# Patient Record
Sex: Female | Born: 2005 | Race: Black or African American | Hispanic: No
Health system: Southern US, Community
[De-identification: ages and names within clinical notes are randomized; demographics above are authoritative.]

## PROBLEM LIST (undated history)

## (undated) DIAGNOSIS — J302 Other seasonal allergic rhinitis: Secondary | ICD-10-CM

---
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
## 2024-03-04 ENCOUNTER — Ambulatory Visit
Admission: RE | Admit: 2024-03-04 | Discharge: 2024-03-04 | Disposition: A | Discharge status: Home / Self Care | Source: Ambulatory Visit | Attending: Family Medicine | Admitting: Family Medicine

## 2024-03-04 VITALS — BP 139/80 | HR 90 | Temp 98.5°F | Resp 17

## 2024-03-04 DIAGNOSIS — U071 COVID-19: Secondary | ICD-10-CM | POA: Diagnosis not present

## 2024-03-04 DIAGNOSIS — R051 Acute cough: Secondary | ICD-10-CM

## 2024-03-04 LAB — POC SOFIA SARS ANTIGEN FIA: SARS Coronavirus 2 Ag: POSITIVE — AB

## 2024-03-04 MED ORDER — ONDANSETRON HCL 4 MG PO TABS: 4.0000 mg | ORAL_TABLET | Freq: Three times a day (TID) | ORAL | Status: DC | PRN

## 2024-03-04 NOTE — ED Provider Notes (Signed)
 [facility]    CSN: [id number] Arrival date & time: 03/04/24  1446      History   Chief Complaint Chief Complaint  Patient presents with   Nausea   Chills    HPI Susan Gallagher is a 18 y.o. female  presents for evaluation of URI symptoms for 3 days. Patient reports associated symptoms of cough, congestion, nausea, chills, sore throat. Denies vomiting, diarrhea, fevers, ear pain, body aches, shortness of breath. Patient does not have a hx of asthma. Patient is not an active smoker.   Reports  sick contacts via boyfriend.  Pt has taken TheraFlu OTC for symptoms. Pt has no other concerns at this time.   HPI  Past Medical History:  Diagnosis Date   Seasonal allergies     Patient Active Problem List   Diagnosis Date Noted   Obesity due to excess calories without serious comorbidity with body mass index (BMI) in 95th to 98th percentile for age in pediatric patient 06/05/2017   Pre-diabetes 08/06/2016   Acanthosis 12/03/2015   Elevated hemoglobin A1c 12/03/2015   Abnormal thyroid  function test 12/03/2015    History reviewed. No pertinent surgical history.  OB History   No obstetric history on file.      Home Medications    Prior to Admission medications   Medication Sig Start Date End Date Taking? Authorizing Provider  ondansetron  (ZOFRAN ) 4 MG tablet Take 1 tablet (4 mg total) by mouth every 8 (eight) hours as needed for nausea or vomiting. 03/04/24  Yes Pinchas Reither, Jodi R, NP  cetirizine  (ZYRTEC  ALLERGY) 10 MG tablet Take 1 tablet (10 mg total) by mouth daily. 01/30/22   Stuart Vernell Norris, PA-C  cetirizine  (ZYRTEC ) 1 MG/ML syrup Take 5 mg by mouth at bedtime. Reported on 01/14/2016    [provider]  fluticasone  (FLONASE ) 50 MCG/ACT nasal spray Place 1 spray into both nostrils 2 (two) times daily. 01/30/22   Stuart Vernell Norris, PA-C  levothyroxine  (SYNTHROID , LEVOTHROID) 25 MCG tablet GIVE [place] 1 TABLET(25 MCG) BY MOUTH DAILY BEFORE BREAKFAST 12/21/17    Verdon Darnel, NP  pyrethrins-piperonyl butoxide (RID) 0.3-3 % SHAM Apply 1 application topically once. Reported on 01/14/2016    [provider]    Family History History reviewed. No pertinent family history.  Social History Social History   Tobacco Use   Smoking status: Passive Smoke Exposure - Never Smoker   Smokeless tobacco: Never  Substance Use Topics   Alcohol use: No   Drug use: No     Allergies   Patient has no known allergies.   Review of Systems Review of Systems  Constitutional:  Positive for chills.  HENT:  Positive for congestion and sore throat.   Respiratory:  Positive for cough.   Gastrointestinal:  Positive for nausea.     Physical Exam Triage Vital Signs ED Triage Vitals  Encounter Vitals Group     BP 03/04/24 1517 139/80     Girls Systolic BP Percentile --      Girls Diastolic BP Percentile --      Boys Systolic BP Percentile --      Boys Diastolic BP Percentile --      Pulse Rate 03/04/24 1517 90     Resp 03/04/24 1517 17     Temp 03/04/24 1517 98.5 F (36.9 C)     Temp Source 03/04/24 1517 Oral     SpO2 03/04/24 1517 98 %     Weight --  Height --      Head Circumference --      Peak Flow --      Pain Score 03/04/24 1516 0     Pain Loc --      Pain Education --      Exclude from Growth Chart --    No data found.  Updated Vital Signs BP 139/80 (BP Location: Right Arm)   Pulse 90   Temp 98.5 F (36.9 C) (Oral)   Resp 17   LMP 03/04/2024 (Exact Date)   SpO2 98%   Visual Acuity Right Eye Distance:   Left Eye Distance:   Bilateral Distance:    Right Eye Near:   Left Eye Near:    Bilateral Near:     Physical Exam Vitals and nursing note reviewed.  Constitutional:      General: She is not in acute distress.    Appearance: She is well-developed. She is not ill-appearing.  HENT:     Head: Normocephalic and atraumatic.     Right Ear: Tympanic membrane and ear canal normal.     Left Ear: Tympanic membrane  and ear canal normal.     Nose: Congestion present.     Mouth/Throat:     Mouth: Mucous membranes are moist.     Pharynx: Oropharynx is clear. Uvula midline. No oropharyngeal exudate or posterior oropharyngeal erythema.     Tonsils: No tonsillar exudate or tonsillar abscesses.  Eyes:     Conjunctiva/sclera: Conjunctivae normal.     Pupils: Pupils are equal, round, and reactive to light.  Cardiovascular:     Rate and Rhythm: Normal rate and regular rhythm.     Heart sounds: Normal heart sounds.  Pulmonary:     Effort: Pulmonary effort is normal.     Breath sounds: Normal breath sounds. No wheezing or rhonchi.  Musculoskeletal:     Cervical back: Normal range of motion and neck supple.  Lymphadenopathy:     Cervical: No cervical adenopathy.  Skin:    General: Skin is warm and dry.  Neurological:     General: No focal deficit present.     Mental Status: She is alert and oriented to person, place, and time.  Psychiatric:        Mood and Affect: Mood normal.        Behavior: Behavior normal.      UC Treatments / Results  Labs (all labs ordered are listed, but only abnormal results are displayed) Labs Reviewed  POC SOFIA SARS ANTIGEN FIA - Abnormal; Notable for the following components:      Result Value   SARS Coronavirus 2 Ag Positive (*)    All other components within normal limits    EKG   Radiology No results found.  Procedures Procedures (including critical care time)  Medications Ordered in UC Medications - No data to display  Initial Impression / Assessment and Plan / UC Course  I have reviewed the triage vital signs and the nursing notes.  Pertinent labs & imaging results that were available during my care of the patient were reviewed by me and considered in my medical decision making (see chart for details).     Reviewed exam and symptoms with patient.  No red flags.  Positive COVID-19.  States she has had in past without complication or  hospitalization.  Will do Zofran  as needed for nausea.  Declines antiviral therapy.  Discussed viral illness and symptomatic treatment.  Advised PCP follow-up if symptoms do not  improve.  ER precautions reviewed. Final Clinical Impressions(s) / UC Diagnoses   Final diagnoses:  Acute cough  COVID-19     Discharge Instructions      You have tested positive for COVID.  You may use Zofran  every 8 hours as needed for nausea.  Please treat your symptoms with over the counter cough medication, tylenol  or ibuprofen , humidifier, and rest. Viral illnesses can last 7-14 days. Please follow up with your PCP if your symptoms are not improving. Please go to the ER for any worsening symptoms. This includes but is not limited to fever you can not control with tylenol  or ibuprofen , you are not able to stay hydrated, you have shortness of breath or chest pain.  Thank you for choosing [facility] for your healthcare needs. I hope you feel better soon!      ED Prescriptions     Medication Sig Dispense Auth. Provider   ondansetron  (ZOFRAN ) 4 MG tablet Take 1 tablet (4 mg total) by mouth every 8 (eight) hours as needed for nausea or vomiting. 8 tablet Tayllor Breitenstein, Jodi R, NP      PDMP not reviewed this encounter.   Loreda Myla SAUNDERS, NP 03/04/24 [phone]

## 2024-03-04 NOTE — ED Triage Notes (Signed)
 Pt present with nausea, dizziness and chills x three days.  Home interventions: therafly States she needs a note for work.

## 2024-03-04 NOTE — Discharge Instructions (Signed)
 You have tested positive for COVID.  You may use Zofran  every 8 hours as needed for nausea.  Please treat your symptoms with over the counter cough medication, tylenol  or ibuprofen , humidifier, and rest. Viral illnesses can last 7-14 days. Please follow up with your PCP if your symptoms are not improving. Please go to the ER for any worsening symptoms. This includes but is not limited to fever you can not control with tylenol  or ibuprofen , you are not able to stay hydrated, you have shortness of breath or chest pain.  Thank you for choosing [facility] for your healthcare needs. I hope you feel better soon!

## 2024-05-04 ENCOUNTER — Ambulatory Visit
Admission: RE | Admit: 2024-05-04 | Discharge: 2024-05-04 | Disposition: A | Discharge status: Home / Self Care | Source: Ambulatory Visit | Attending: Occupational Medicine | Admitting: Occupational Medicine

## 2024-05-04 ENCOUNTER — Other Ambulatory Visit: Payer: Self-pay | Admitting: Occupational Medicine

## 2024-05-04 ENCOUNTER — Ambulatory Visit: Payer: Self-pay | Admitting: General Practice

## 2024-05-04 VITALS — BP 112/60 | HR 68 | Temp 97.7°F | Resp 18

## 2024-05-04 DIAGNOSIS — M79641 Pain in right hand: Secondary | ICD-10-CM | POA: Diagnosis present

## 2024-05-04 DIAGNOSIS — Y990 Civilian activity done for income or pay: Secondary | ICD-10-CM | POA: Insufficient documentation

## 2024-05-04 DIAGNOSIS — R52 Pain, unspecified: Secondary | ICD-10-CM | POA: Insufficient documentation

## 2024-05-04 DIAGNOSIS — X58XXXA Exposure to other specified factors, initial encounter: Secondary | ICD-10-CM | POA: Insufficient documentation

## 2024-05-04 NOTE — Progress Notes (Addendum)
 Employee presents to the clinic this am with a Rt hand injury, 4th & 5th digits with more pain than others. Reports Rt hand was smashed between a ladder and shelving at 0730 today. States having constant shooting pain from last 2 digits up the Rt forearm. No other complaints.  VSS; RN assessed Rt hand, no redness, decrease in color, no cold temperature and no bruising noted. Positive radial pulse felt, slight swelling to the hand noted. Employee is able to make a Rt hand partial fist, wiggle fingers and spread fingers apart slowly. Ice pack applied to the Rt hand, secured with medical tape. Employee states ice pack was helping the pain slightly subside at the end of the appointment.  Appointment secured at the [facility] site clinic. Appropriate paperwork and contact information to the clinic provided. RN will follow up later today to further assess needs.   Addendum to earlier note: RN placed a call to follow up with employee this afternoon. Per employee, she was seen by the PA and an xray was done. Requested d/c orders to be emailed to the clinic and email was received. Advised to take Tylenol  per d/c papers or Ibuprofen  after having something to eat. Will follow up tomorrow.

## 2024-06-16 ENCOUNTER — Ambulatory Visit: Admitting: Registered Nurse

## 2024-06-16 ENCOUNTER — Encounter: Payer: Self-pay | Admitting: Registered Nurse

## 2024-06-16 VITALS — BP 130/74 | HR 90 | Temp 99.9°F | Resp 16

## 2024-06-16 DIAGNOSIS — J039 Acute tonsillitis, unspecified: Secondary | ICD-10-CM

## 2024-06-16 MED ORDER — ACETAMINOPHEN 500 MG PO TABS: 1000.0000 mg | ORAL_TABLET | Freq: Four times a day (QID) | ORAL | Status: AC | PRN

## 2024-06-16 MED ORDER — IBUPROFEN 800 MG PO TABS: 800.0000 mg | ORAL_TABLET | Freq: Three times a day (TID) | ORAL | Status: AC

## 2024-06-16 MED ORDER — PREDNISONE 10 MG PO TABS: ORAL_TABLET | ORAL | Status: AC

## 2024-06-16 MED ORDER — AMOXICILLIN 875 MG PO TABS: 875.0000 mg | ORAL_TABLET | Freq: Two times a day (BID) | ORAL | 0 refills | Status: AC

## 2024-06-16 NOTE — Patient Instructions (Signed)
 Tonsillitis  Tonsillitis is an infection of the throat that causes the tonsils to become red, tender, and swollen. Tonsils are tissues in the back of your throat. Each tonsil has crevices (crypts). Tonsils normally work to protect the body from infection. What are the causes? Sudden (acute) tonsillitis may be caused by a virus or bacteria, including streptococcal bacteria. Long-lasting (chronic) tonsillitis occurs when the crypts of the tonsils become filled with pieces of food and bacteria, which makes it easy for the tonsils to become repeatedly infected. Tonsillitis can be spread from person to person when it is caused by a virus or bacteria. It may be spread by inhaling droplets that are released with coughing or sneezing. You may also come into contact with viruses or bacteria on surfaces, such as cups or utensils. What are the signs or symptoms? Symptoms of this condition include: A sore throat. This may include trouble swallowing. White patches on the tonsils. Swollen tonsils. Fever. Headache. Tiredness. Loss of appetite. Snoring during sleep when you did not snore before. Small, foul-smelling, yellowish-white pieces of material (tonsilloliths) that you occasionally cough up or spit out. These can cause you to have bad breath. How is this diagnosed? This condition is diagnosed with a physical exam. Diagnosis can be confirmed with the results of lab tests, including a throat culture. How is this treated? Treatment for this condition depends on the cause, but usually focuses on treating the symptoms associated with it. Treatment may include: Medicines to relieve pain and manage fever. Steroid medicines to reduce swelling. Antibiotic medicines if the condition is caused by bacteria. If episodes of tonsillitis are severe and frequent, your health care provider may recommend surgery to remove the tonsils (tonsillectomy). Follow these instructions at home: Medicines Take over-the-counter  and prescription medicines only as told by your health care provider. If you were prescribed an antibiotic medicine, take it as told by your health care provider. Do not stop taking the antibiotic even if you start to feel better. Eating and drinking Drink enough fluid to keep your urine pale yellow. While your throat is sore, eat soft or liquid foods, such as sherbet, soups, or soft, warm cereals, such as oatmeal or hot wheat cereal. Drink warm liquids. Eat frozen ice pops. General instructions Rest as much as possible and get plenty of sleep. Gargle with a mixture of salt and water 3-4 times a day or as needed. To make salt water, completely dissolve -1 tsp (3-6 g) of salt in 1 cup (237 mL) of warm water. Do not swallow the mixture of salt and water. Wash your hands regularly with soap and water for at least 20 seconds. If soap and water are not available, use hand sanitizer. Do not share cups, bottles, or other utensils until your symptoms have gone away. Do not use any products that contain nicotine or tobacco. These products include cigarettes, chewing tobacco, and vaping devices, such as e-cigarettes. If you need help quitting, ask your health care provider. Keep all follow-up visits. This is important. Contact a health care provider if: You notice large, tender lumps in your neck that were not there before. You have a fever that does not go away after 2-3 days. You develop a rash. You cough up a green, yellow-brown, or bloody substance. You cannot swallow liquids or food for 24 hours. Only one of your tonsils is swollen. Get help right away if: You develop any new symptoms, such as vomiting, severe headache, stiff neck, chest pain, trouble breathing, or  trouble swallowing. You have severe throat pain along with drooling or voice changes. You have severe pain that is not controlled with medicines. You cannot fully open your mouth. You develop redness, swelling, or severe pain  anywhere in your neck. Summary Tonsillitis is an infection of the throat that causes the tonsils to become red, tender, and swollen. The most common symptom is pain in the throat. Tonsillitis is most often caused by a virus or bacteria. Get help right away if you develop any new symptoms, such as vomiting, severe headache, stiff neck, chest pain, or trouble breathing. This information is not intended to replace advice given to you by your health care provider. Make sure you discuss any questions you have with your health care provider. Document Revised: 11/07/2020 Document Reviewed: 11/08/2020 Elsevier Patient Education  2024 [employer].

## 2024-06-16 NOTE — Progress Notes (Signed)
 Established Patient Office Visit  Subjective   Patient ID: Susan Gallagher, female    DOB: 11/01/2005  Age: 18 y.o. MRN: [id number]  Chief Complaint  Patient presents with   Sore Throat    Headache tonsils swollen    18y/o new patient here for evaluation throat pain and headache.  Has tried tylenol  at home.  Some pain/difficulty with swallowing.  Denied shortness of breath, drooling, wheezing, chills.  Has required steroid pills in the past when tonsils large.      Review of Systems  Constitutional:  Positive for malaise/fatigue. Negative for chills.  HENT:  Positive for congestion and sore throat. Negative for ear discharge, ear pain, hearing loss, nosebleeds, sinus pain and tinnitus.   Eyes:  Negative for pain, discharge and redness.  Respiratory:  Negative for cough, hemoptysis, sputum production, shortness of breath, wheezing and stridor.   Cardiovascular:  Negative for chest pain.  Gastrointestinal:  Negative for diarrhea and vomiting.  Genitourinary:  Negative for dysuria.  Musculoskeletal:  Negative for myalgias.  Skin:  Negative for rash.  Neurological:  Positive for headaches. Negative for dizziness, tingling, tremors, speech change, seizures and weakness.  Endo/Heme/Allergies:  Positive for environmental allergies.      Objective:     BP 130/74 (Patient Position: Sitting)   Pulse 90   Temp 99.9 F (37.7 C) (Tympanic)   Resp 16   LMP 05/22/2024 (Exact Date)   SpO2 98%    Physical Exam Vitals and nursing note reviewed.  Constitutional:      General: She is awake. She is not in acute distress.    Appearance: Normal appearance. She is well-developed and well-groomed. She is obese. She is not ill-appearing, toxic-appearing or diaphoretic.  HENT:     Head: Normocephalic and atraumatic. No raccoon eyes, Battle's sign, right periorbital erythema or left periorbital erythema.     Jaw: There is normal jaw occlusion.     Salivary Glands: Right salivary gland is not  diffusely enlarged or tender. Left salivary gland is not diffusely enlarged or tender.     Right Ear: Hearing and external ear normal. No decreased hearing noted. No laceration, drainage, swelling or tenderness. A middle ear effusion is present. There is no impacted cerumen. No foreign body. No mastoid tenderness. No PE tube. No hemotympanum. Tympanic membrane is not injected, scarred, perforated or erythematous.     Left Ear: Hearing and external ear normal. No decreased hearing noted. No laceration, drainage, swelling or tenderness. A middle ear effusion is present. There is no impacted cerumen. No foreign body. No mastoid tenderness. No PE tube. No hemotympanum. Tympanic membrane is erythematous. Tympanic membrane is not injected, scarred or perforated.     Ears:     Comments: Bilateral TMs intact air fluid level clear; left TM with mild erythema along with proximal canal; no debris bilateral auditory canals    Nose: Nose normal. No mucosal edema, congestion or rhinorrhea.     Right Nostril: No epistaxis.     Left Nostril: No epistaxis.     Right Turbinates: Not enlarged, swollen or pale.     Left Turbinates: Not enlarged, swollen or pale.     Right Sinus: No maxillary sinus tenderness or frontal sinus tenderness.     Left Sinus: No maxillary sinus tenderness or frontal sinus tenderness.     Mouth/Throat:     Lips: Pink. No lesions.     Mouth: Mucous membranes are moist. No oral lesions or angioedema.     Dentition:  No gum lesions.     Tongue: No lesions. Tongue does not deviate from midline.     Palate: No mass and lesions.     Pharynx: Uvula midline. Pharyngeal swelling and posterior oropharyngeal erythema present. No oropharyngeal exudate or uvula swelling.     Tonsils: No tonsillar exudate or tonsillar abscesses. 4+ on the right. 4+ on the left.     Comments: Bilateral tonsils touching no exudate; macular erythema; cobblestoning posterior pharynx; bilateral allergic shiners Eyes:      General: Lids are normal. Vision grossly intact. Gaze aligned appropriately. Allergic shiner present. No scleral icterus.       Right eye: No discharge.        Left eye: No discharge.     Extraocular Movements: Extraocular movements intact.     Right eye: Normal extraocular motion.     Left eye: Normal extraocular motion.     Conjunctiva/sclera: Conjunctivae normal.     Pupils: Pupils are equal, round, and reactive to light.  Neck:     Trachea: Trachea and phonation normal.     Comments: Left anterior cervical lymph node TTP and lateral left neck soft tissue mildly TTP Cardiovascular:     Rate and Rhythm: Normal rate and regular rhythm.     Pulses: Normal pulses.          Radial pulses are 2+ on the right side and 2+ on the left side.  Pulmonary:     Effort: Pulmonary effort is normal. No respiratory distress.     Breath sounds: Normal breath sounds and air entry. No stridor or transmitted upper airway sounds. No decreased breath sounds, wheezing, rhonchi or rales.     Comments: Spoke full sentences without difficulty; no cough observed in exam room; BBS CTA Abdominal:     General: Abdomen is flat.     Palpations: Abdomen is soft.  Musculoskeletal:        General: Normal range of motion.     Right hand: Normal strength. Normal capillary refill.     Left hand: Normal strength. Normal capillary refill.     Cervical back: Normal range of motion and neck supple. Tenderness present. No swelling, edema, deformity, erythema, signs of trauma, lacerations, rigidity, spasms, torticollis, bony tenderness or crepitus. Muscular tenderness present. No pain with movement or spinous process tenderness. Normal range of motion.     Thoracic back: No swelling, edema, deformity, signs of trauma, lacerations, spasms or tenderness. Normal range of motion.  Lymphadenopathy:     Head:     Right side of head: No submental, submandibular, tonsillar, preauricular, posterior auricular or occipital adenopathy.      Left side of head: No submental, submandibular, tonsillar, preauricular, posterior auricular or occipital adenopathy.     Cervical: Cervical adenopathy present.     Right cervical: No superficial, deep or posterior cervical adenopathy.    Left cervical: Deep cervical adenopathy present. No superficial or posterior cervical adenopathy.  Skin:    General: Skin is warm and dry.     Capillary Refill: Capillary refill takes less than 2 seconds.     Coloration: Skin is not ashen, cyanotic, jaundiced, mottled, pale or sallow.     Findings: No abrasion, abscess, acne, bruising, burn, ecchymosis, erythema, signs of injury, laceration, lesion, petechiae, rash or wound.     Comments: Anterior face/neck/hands visually inspected  Neurological:     General: No focal deficit present.     Mental Status: She is alert and oriented to person, place, and  time. Mental status is at baseline.     GCS: GCS eye subscore is 4. GCS verbal subscore is 5. GCS motor subscore is 6.     Cranial Nerves: No cranial nerve deficit, dysarthria or facial asymmetry.     Sensory: Sensation is intact.     Motor: Motor function is intact. No weakness, tremor, atrophy, abnormal muscle tone or seizure activity.     Coordination: Coordination is intact. Coordination normal.     Gait: Gait is intact. Gait normal.     Comments: In/out of chair without difficulty; gait sure and steady in clinic; bilateral hand grasp equal 5/5  Psychiatric:        Attention and Perception: Attention and perception normal.        Mood and Affect: Mood and affect normal.        Speech: Speech normal.        Behavior: Behavior normal. Behavior is cooperative.        Thought Content: Thought content normal.        Cognition and Memory: Cognition and memory normal.        Judgment: Judgment normal.    Patient took first dose prednisone  40mg  in clinic along with ibuprofen  400mg  and will take amoxicillin  in 1 hour.  Tolerated water and pills without  difficulty.  No results found for any visits on 06/16/24.    The ASCVD Risk score (Arnett DK, et al., 2019) failed to calculate for the following reasons:   The 2019 ASCVD risk score is only valid for ages 56 to 57   * - Cholesterol units were assumed    Assessment & Plan:   Problem List Items Addressed This Visit   None Visit Diagnoses       Acute tonsillitis, unspecified etiology    -  Primary      Usually no specific medical treatment is needed if a virus is causing the sore throat. The throat most often gets better on its own within 5 to 7 days. Antibiotic medicine does not cure viral pharyngitis.  -For acute pharyngitis caused by bacteria, your healthcare provider will prescribe an antibiotic.  Amoxicillin  875mg  po every 12 hours x 10 days #20 RF0 dispensed from [facility] to patient  Use back up contraception while on antibiotics.   -Prednisone  40mg  (4 tabs with breakfast x 4 days) then taper 30mg  x 1 day 20mg  x 1 day #21 RF0 electronic Rx sent to patient pharmacy of choice -Motrin /Ibuprofen  800mg  by mouth every 8 hours with food or Tylenol  1000mg  by mouth every 6 hours as needed for pain or fever OTC given 4 UD each from clinic stock ER/call 911 if drooling, unable to swallow, trouble breathing discussed tonsillar abscess symptoms/hot potato voice. -Do not smoke.  -Avoid secondhand smoke and other air pollutants.  -Use a cool mist humidifier to add moisture to the air.  -Get plenty of rest; sleep 7-8 hours per night -You may want to rest your throat by talking less and eating a diet that is mostly liquid or soft for a day or two.  -Nonprescription throat lozenges and mouthwashes should help relieve the soreness.  -Gargling with warm saltwater and drinking warm liquids may help. (You can make a saltwater solution by adding 1/4 teaspoon of salt to 8 ounces, or 240 mL, of warm water.)  -Change your toothbrush on your last day of antibiotic -Avoid oral intimate contact until  antibiotics finished -Wash dishes/silverware/glasses in hot water or diswasher -Do not share glasses/silverware/dishes  during meals that touch your lips/mouth -Usually no specific medical treatment is needed if a virus is causing the sore throat. The throat most often gets better on its own within 5 to 7 days. Antibiotic medicine does not cure viral pharyngitis. DDx mononucleosis, strep throat, hand foot and mouth, post nasal drip irritation pharynx/throat -Exitcare handout on pharyngitis acute and strep throat given to patient FOLLOW UP with clinic provider if no improvements in the next 7-10 days. Patient verbalized understanding of instructions and agreed with plan of care.  P2: Hand washing and diet.    Return if symptoms worsen or fail to improve.    Ellouise DELENA Hope, NP

## 2024-07-05 ENCOUNTER — Ambulatory Visit

## 2024-07-06 ENCOUNTER — Ambulatory Visit (HOSPITAL_COMMUNITY)
Admission: RE | Admit: 2024-07-06 | Discharge: 2024-07-06 | Disposition: A | Discharge status: Home / Self Care | Source: Ambulatory Visit | Attending: Family Medicine | Admitting: Family Medicine

## 2024-07-06 ENCOUNTER — Encounter (HOSPITAL_COMMUNITY): Payer: Self-pay

## 2024-07-06 ENCOUNTER — Other Ambulatory Visit: Payer: Self-pay

## 2024-07-06 VITALS — BP 109/73 | HR 81 | Temp 98.4°F | Resp 18

## 2024-07-06 DIAGNOSIS — R1010 Upper abdominal pain, unspecified: Secondary | ICD-10-CM | POA: Diagnosis not present

## 2024-07-06 DIAGNOSIS — R109 Unspecified abdominal pain: Secondary | ICD-10-CM

## 2024-07-06 DIAGNOSIS — R6889 Other general symptoms and signs: Secondary | ICD-10-CM

## 2024-07-06 LAB — POCT URINE DIPSTICK
Bilirubin, UA: NEGATIVE
Blood, UA: NEGATIVE
Glucose, UA: NEGATIVE mg/dL
Ketones, POC UA: NEGATIVE mg/dL
Leukocytes, UA: NEGATIVE
Nitrite, UA: NEGATIVE
POC PROTEIN,UA: NEGATIVE
Spec Grav, UA: 1.01
Urobilinogen, UA: 0.2 U/dL
pH, UA: 5.5

## 2024-07-06 LAB — GLUCOSE, POCT (MANUAL RESULT ENTRY): POC Glucose: 86 mg/dL (ref 70–99)

## 2024-07-06 LAB — POCT URINE PREGNANCY: Preg Test, Ur: NEGATIVE

## 2024-07-06 NOTE — ED Provider Notes (Addendum)
 " [facility]   [id number] 07/06/24 Arrival Time: 1455  ASSESSMENT & PLAN:  1. Abdominal cramping   2. Not feeling great   3. Upper abdominal discomfort    Benign abdominal exam. No indications for urgent abdominal/pelvic imaging at this time. Declines lab work/STI testing. Prefers home observation over next 24 hours.  Results for orders placed or performed during the hospital encounter of 07/06/24  POC Urinalysis Dipstick   Collection Time: 07/06/24  4:43 PM  Result Value Ref Range   Color, UA yellow yellow   Clarity, UA cloudy (A) clear   Glucose, UA negative negative mg/dL   Bilirubin, UA negative negative   Ketones, POC UA negative negative mg/dL   Spec Grav, UA 8.989 8.989 - 1.025   Blood, UA negative negative   pH, UA 5.5 5.0 - 8.0   POC PROTEIN,UA negative negative, trace   Urobilinogen, UA 0.2 0.2 or 1.0 E.U./dL   Nitrite, UA Negative Negative   Leukocytes, UA Negative Negative  POCT urine pregnancy   Collection Time: 07/06/24  4:43 PM  Result Value Ref Range   Preg Test, Ur Negative Negative  POCT glucose (manual entry)   Collection Time: 07/06/24  4:48 PM  Result Value Ref Range   POC Glucose 86 70 - 99 mg/dl      Discharge Instructions      You have been seen today for abdominal pain. Your evaluation was not suggestive of any emergent condition requiring medical intervention at this time. However, some abdominal problems make take more time to appear. Therefore, it is very important for you to pay attention to any new symptoms or worsening of your current condition.  Please return here or to the Emergency Department immediately should you begin to feel worse in any way or have any of the following symptoms: increasing or different abdominal pain, persistent vomiting, inability to drink fluids, fevers, or shaking chills.       Follow-up Information     Schedule an appointment as soon as possible for a visit  with [facility].   Specialty: Pediatrics Why: For follow up. Contact information: [address] [place] [address] [phone]         [facility] at [facility].   Specialty: Emergency Medicine Why: If symptoms worsen in any way. Contact information: [address] [place]  [phone]                Reviewed expectations re: course of current medical issues. Questions answered. Outlined signs and symptoms indicating need for more acute intervention. Patient verbalized understanding. After Visit Summary given.   SUBJECTIVE: History from: patient. Susan Gallagher is a 19 y.o. female who presents with complaint of intermittent upper abd cramping/discomfort. Worse yesterday; somewhat better today. Mild diarrhea; non-bloody. Nausea yesterday but not today. Slight runny nose. Denies fever. Overall decreased PO intake past 24 hours. No tx PTA. Patient's last menstrual period was 06/26/2024 (exact date).  History reviewed. No pertinent surgical history.   OBJECTIVE:  Vitals:   07/06/24 1555  BP: 109/73  Pulse: 81  Resp: 18  Temp: 98.4 F (36.9 C)  TempSrc: Oral  SpO2: 97%    General appearance: alert, oriented, no acute distress HEENT: [place]; AT; oropharynx moist Lungs: unlabored respirations Abdomen: soft; without distention; mild  and poorly localized tenderness to palpation over upper abdomen; without masses or organomegaly; without guarding or rebound tenderness Back: without reported CVA  tenderness; FROM at waist Extremities: without LE edema; symmetrical; without gross deformities Skin: warm and dry Neurologic: normal gait Psychological: alert and cooperative; normal mood and affect  Labs: Results for orders placed or performed during the hospital encounter of 07/06/24  POC Urinalysis Dipstick   Collection Time: 07/06/24  4:43 PM  Result Value Ref Range   Color, UA yellow yellow   Clarity, UA  cloudy (A) clear   Glucose, UA negative negative mg/dL   Bilirubin, UA negative negative   Ketones, POC UA negative negative mg/dL   Spec Grav, UA 8.989 8.989 - 1.025   Blood, UA negative negative   pH, UA 5.5 5.0 - 8.0   POC PROTEIN,UA negative negative, trace   Urobilinogen, UA 0.2 0.2 or 1.0 E.U./dL   Nitrite, UA Negative Negative   Leukocytes, UA Negative Negative  POCT urine pregnancy   Collection Time: 07/06/24  4:43 PM  Result Value Ref Range   Preg Test, Ur Negative Negative  POCT glucose (manual entry)   Collection Time: 07/06/24  4:48 PM  Result Value Ref Range   POC Glucose 86 70 - 99 mg/dl   Labs Reviewed  POCT URINE DIPSTICK - Abnormal; Notable for the following components:      Result Value   Clarity, UA cloudy (*)    All other components within normal limits  POCT URINE PREGNANCY  GLUCOSE, POCT (MANUAL RESULT ENTRY)    Imaging: No results found.   Allergies[1]                                             Past Medical History:  Diagnosis Date   Seasonal allergies     Social History   Socioeconomic History   Marital status: Significant Other    Spouse name: Not on file   Number of children: Not on file   Years of education: Not on file   Highest education level: Not on file  Occupational History   Not on file  Tobacco Use   Smoking status: Never    Passive exposure: Yes   Smokeless tobacco: Never  Vaping Use   Vaping status: Every Day  Substance and Sexual Activity   Alcohol use: No   Drug use: No   Sexual activity: Never    Birth control/protection: Abstinence, None  Other Topics Concern   Not on file  Social History Narrative   Lives at home with mom, mom's boyfriend, maternal grandmother and maternal uncle, Amil Ort is in the 5th grade.    Social Drivers of Health   Tobacco Use: Medium Risk (07/06/2024)   Patient History    Smoking Tobacco Use: Never    Smokeless Tobacco Use: Never    Passive Exposure: Yes  Financial Resource  Strain: Not on file  Food Insecurity: Not on file  Transportation Needs: Not on file  Physical Activity: Not on file  Stress: Not on file  Social Connections: Not on file  Intimate Partner Violence: Not on file  Depression (EYV7-0): Not on file  Alcohol Screen: Not on file  Housing: Not on file  Utilities: Not on file  Health Literacy: Not on file    History reviewed. No pertinent family history.    Rolinda Rogue, MD 07/06/24 1656     [1] No Known Allergies    Rolinda Rogue, MD 07/06/24 1656  "

## 2024-07-06 NOTE — ED Triage Notes (Signed)
 Reports symptoms for 4 days.  Has nausea, abdomen cramping, headache, dizziness, weakness.  Denies any vomiting, patient complains of diarrhea.  2-3 diarrhea stools today.  Reports a slight runny nose.  Patient reports taking mucinex for symptoms

## 2024-07-06 NOTE — Discharge Instructions (Addendum)
 You have been seen today for abdominal pain. Your evaluation was not suggestive of any emergent condition requiring medical intervention at this time. However, some abdominal problems make take more time to appear. Therefore, it is very important for you to pay attention to any new symptoms or worsening of your current condition.  Please return here or to the Emergency Department immediately should you begin to feel worse in any way or have any of the following symptoms: increasing or different abdominal pain, persistent vomiting, inability to drink fluids, fevers, or shaking chills.
